# Patient Record
Sex: Male | Born: 2000 | Race: White | Hispanic: No | Marital: Single | Smoking: Never smoker
Health system: Southern US, Community
[De-identification: ages and names within clinical notes are randomized; demographics above are authoritative.]

---
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
## 2021-05-25 ENCOUNTER — Other Ambulatory Visit: Payer: Self-pay

## 2021-05-25 ENCOUNTER — Ambulatory Visit
Admission: EM | Admit: 2021-05-25 | Discharge: 2021-05-25 | Disposition: A | Discharge status: Home / Self Care | Payer: Self-pay | Attending: Family Medicine | Admitting: Family Medicine

## 2021-05-25 ENCOUNTER — Encounter: Payer: Self-pay | Admitting: Emergency Medicine

## 2021-05-25 DIAGNOSIS — T63451A Toxic effect of venom of hornets, accidental (unintentional), initial encounter: Secondary | ICD-10-CM

## 2021-05-25 DIAGNOSIS — T782XXA Anaphylactic shock, unspecified, initial encounter: Secondary | ICD-10-CM

## 2021-05-25 DIAGNOSIS — R0603 Acute respiratory distress: Secondary | ICD-10-CM

## 2021-05-25 DIAGNOSIS — T63441A Toxic effect of venom of bees, accidental (unintentional), initial encounter: Secondary | ICD-10-CM

## 2021-05-25 DIAGNOSIS — T63461A Toxic effect of venom of wasps, accidental (unintentional), initial encounter: Secondary | ICD-10-CM

## 2021-05-25 MED ORDER — METHYLPREDNISOLONE SODIUM SUCC 125 MG IJ SOLR: 125.0000 mg | Freq: Once | INTRAMUSCULAR | Status: DC

## 2021-05-25 MED ORDER — PREDNISONE 20 MG PO TABS: 40.0000 mg | ORAL_TABLET | Freq: Every day | ORAL | 0 refills | Status: DC

## 2021-05-25 MED ORDER — EPINEPHRINE 0.3 MG/0.3ML IJ SOAJ: 0.3000 mg | INTRAMUSCULAR | 0 refills | Status: AC | PRN

## 2021-05-25 MED ORDER — EPINEPHRINE PF 1 MG/ML IJ SOLN
0.3000 mg | Freq: Once | INTRAMUSCULAR | Status: AC
  Administered 2021-05-25: 0.3 mg via INTRAMUSCULAR

## 2021-05-25 MED ORDER — DIPHENHYDRAMINE HCL 50 MG/ML IJ SOLN
50.0000 mg | Freq: Once | INTRAMUSCULAR | Status: AC
  Administered 2021-05-25: 50 mg via INTRAVENOUS

## 2021-05-25 MED ORDER — METHYLPREDNISOLONE SODIUM SUCC 125 MG IJ SOLR
125.0000 mg | Freq: Once | INTRAMUSCULAR | Status: AC
  Administered 2021-05-25: 125 mg via INTRAVENOUS

## 2021-05-25 MED ORDER — DIPHENHYDRAMINE HCL 50 MG PO CAPS: 50.0000 mg | ORAL_CAPSULE | Freq: Once | ORAL | Status: DC

## 2021-05-25 NOTE — ED Triage Notes (Signed)
Pt got stung by a bee on RT hand x 30 min ago, now is itching all over and has rash all over body

## 2021-05-25 NOTE — ED Provider Notes (Signed)
[place]   [id number] 05/25/21 Arrival Time: 3500  ASSESSMENT & PLAN:  1. Anaphylaxis, initial encounter   2. Toxic reaction to hornets, wasps and bees, accidental or unintentional, initial encounter   3. Respiratory distress    Significant improvement after treatment below. Resp distress has resolved. Breathing comfortably. Tolerates PO liquids without issue. Alert.  Meds ordered this encounter  Medications   EPINEPHrine (ADRENALIN) 0.3 mg   diphenhydrAMINE (BENADRYL) injection 50 mg   methylPREDNISolone sodium succinate (SOLU-MEDROL) 125 mg/2 mL injection 125 mg   Discharged with caregiver to home who is comfortable with observation over next 24 hours.  Prescribed: Meds ordered this encounter  Medications   EPINEPHrine 0.3 mg/0.3 mL IJ SOAJ injection (EpiPEN)    Sig: Inject 0.3 mg into the muscle as needed for anaphylaxis.    Dispense:  1 each    Refill:  0   predniSONE (DELTASONE) 20 MG tablet    Sig: Take 2 tablets (40 mg total) by mouth daily.    Dispense:  10 tablet    Refill:  0   OTC Benadryl if needed.   Follow-up Information     [facility] EMERGENCY DEPARTMENT.   Specialty: Emergency Medicine Why: If symptoms worsen in any way. Contact information: [address] [id number] Prudy Feeler [place] [address] [phone]                Reviewed expectations re: course of current medical issues. Questions answered. Outlined signs and symptoms indicating need for more acute intervention. Patient verbalized understanding. After Visit Summary given.   SUBJECTIVE: History from: patient. Phillip Watkins is a 20 y.o. male who reports insect sting to R hand; approx 30 m ago. Rash, itching now. While here reports feeling SOB. Without n/v/d. Ambulatory. No HA or visual changes. No h/o reaction to stings in the past. Without abd or back pain. No tx PTA.  OBJECTIVE:  Vitals:   05/25/21 1019 05/25/21 1021  BP:  121/64  Pulse: 91    Resp: 20   Temp: 98.4 F (36.9 C)   TempSrc: Oral   SpO2: 97%     General appearance: alert; appears uncomfortable; mild resp distress Eyes: PERRLA; EOMI; conjunctiva normal HENT: normocephalic; atraumatic; oral mucosa normal; PERRLA Neck: supple  Lungs: bilateral exp wheezes present; able to speak in short sentences Heart: regular rate and rhythm Abdomen: soft, non-tender Back: no CVA tenderness Extremities: no cyanosis or edema; symmetrical with no gross deformities Skin: warm and dry; site of sting noted on R hand; mild swelling; no stinger present; mild surrounding erythema; generalized erythematous wheals over body Neurologic: normal gait; normal symmetric reflexes Psychological: alert and cooperative; normal mood and affect  No Known Allergies  History reviewed. No pertinent past medical history. Social History   Socioeconomic History   Marital status: Single    Spouse name: Not on file   Number of children: Not on file   Years of education: Not on file   Highest education level: Not on file  Occupational History   Not on file  Tobacco Use   Smoking status: Not on file   Smokeless tobacco: Not on file  Substance and Sexual Activity   Alcohol use: Not on file   Drug use: Not on file   Sexual activity: Not on file  Other Topics Concern   Not on file  Social History Narrative   Not on file   Social Determinants of Health   Financial Resource Strain: Not on file  Food Insecurity: Not on file  Transportation Needs: Not on file  Physical Activity: Not on file  Stress: Not on file  Social Connections: Not on file  Intimate Partner Violence: Not on file   No family history on file. History reviewed. No pertinent surgical history.   Vanessa Kick, MD 05/25/21 1230

## 2021-06-11 ENCOUNTER — Ambulatory Visit
Admission: EM | Admit: 2021-06-11 | Discharge: 2021-06-11 | Disposition: A | Discharge status: Home / Self Care | Payer: 59 | Attending: Emergency Medicine | Admitting: Emergency Medicine

## 2021-06-11 DIAGNOSIS — L237 Allergic contact dermatitis due to plants, except food: Secondary | ICD-10-CM

## 2021-06-11 DIAGNOSIS — R21 Rash and other nonspecific skin eruption: Secondary | ICD-10-CM | POA: Diagnosis not present

## 2021-06-11 MED ORDER — PREDNISONE 10 MG (21) PO TBPK: ORAL_TABLET | Freq: Every day | ORAL | 0 refills | Status: AC

## 2021-06-11 MED ORDER — DEXAMETHASONE SODIUM PHOSPHATE 10 MG/ML IJ SOLN
10.0000 mg | Freq: Once | INTRAMUSCULAR | Status: AC
  Administered 2021-06-11: 10 mg via INTRAMUSCULAR

## 2021-06-11 NOTE — Discharge Instructions (Addendum)
Wash with warm water and mild soap Steroid shot given in office Prednisone prescribed.  Take as directed and to completion Use OTC zyrtec, allegra, or claritin during the day.  Benadryl at night. You may also use OTC hydrocortisone cream and/or calamine lotion to help alleviate itching Follow up with PCP if symptoms persists  Return or go to the ED if you have any new or worsening symptoms such as fever, chills, nausea, vomiting, difficulty breathing, throat swelling, tongue swelling, numbness/ tingling in mouth, worsening symptoms despite treatment, etc..Marland Kitchen

## 2021-06-11 NOTE — ED Triage Notes (Signed)
Pt present to the UC for generalized body rash x 1 week. 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 Treating rash with benadryl with no relief.   Denies fever.

## 2021-06-11 NOTE — ED Provider Notes (Signed)
[place]   [id number] 06/11/21 Arrival Time: 1204  CC: rash  SUBJECTIVE:  Phillip Watkins is a 20 y.o. male who presents with a rash to UE x 1 week.  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  Localizes the rash to UE and chest  Describes it as itchy, red, and spreading.  Has tried OTC benadryl with relief.  Symptoms are made worse with scratching.  Denies similar symptoms in the past.   Denies fever, chills, nausea, vomiting, swelling, discharge.  ROS: As per HPI.  All other pertinent ROS negative.     History reviewed. No pertinent past medical history. History reviewed. No pertinent surgical history. Allergies  Allergen Reactions   Bee Venom Hives and Shortness Of Breath   No current facility-administered medications on file prior to encounter.   Current Outpatient Medications on File Prior to Encounter  Medication Sig Dispense Refill   EPINEPHrine 0.3 mg/0.3 mL IJ SOAJ injection Inject 0.3 mg into the muscle as needed for anaphylaxis. 1 each 0   Social History   Socioeconomic History   Marital status: Single    Spouse name: Not on file   Number of children: Not on file   Years of education: Not on file   Highest education level: Not on file  Occupational History   Not on file  Tobacco Use   Smoking status: Never   Smokeless tobacco: Never  Vaping Use   Vaping Use: Never used  Substance and Sexual Activity   Alcohol use: Not Currently    Comment: social drinker   Drug use: Never   Sexual activity: Not on file  Other Topics Concern   Not on file  Social History Narrative   Not on file   Social Determinants of Health   Financial Resource Strain: Not on file  Food Insecurity: Not on file  Transportation Needs: Not on file  Physical Activity: Not on file  Stress: Not on file  Social Connections: Not on file  Intimate Partner Violence: Not on file   History reviewed. No pertinent family history.  OBJECTIVE: Vitals:    06/11/21 1233  BP: 120/75  Pulse: 62  Resp: 18  Temp: 97.9 F (36.6 C)  TempSrc: Oral  SpO2: 97%    General appearance: alert; no distress Head: NCAT Lungs: normal respiratory effort Extremities: no edema Skin: warm and dry; areas of linear papules and vesicles with surrounding erythema to bilateral UE and chest Psychological: alert and cooperative; normal mood and affect  ASSESSMENT & PLAN:  1. Rash and nonspecific skin eruption   2. Poison ivy dermatitis     Meds ordered this encounter  Medications   predniSONE (STERAPRED UNI-PAK 21 TAB) 10 MG (21) TBPK tablet    Sig: Take by mouth daily. Take 6 tabs by mouth daily  for 2 days, then 5 tabs for 2 days, then 4 tabs for 2 days, then 3 tabs for 2 days, 2 tabs for 2 days, then 1 tab by mouth daily for 2 days    Dispense:  42 tablet    Refill:  0    Order Specific Question:   Supervising Provider    Answer:   Raylene Everts [[id number]]   dexamethasone (DECADRON) injection 10 mg   Wash with warm water and mild soap Steroid shot given in office Prednisone prescribed.  Take as directed and to completion Use OTC zyrtec, allegra, or claritin during the day.  Benadryl at night. You may also use  OTC hydrocortisone cream and/or calamine lotion to help alleviate itching Follow up with PCP if symptoms persists  Return or go to the ED if you have any new or worsening symptoms such as fever, chills, nausea, vomiting, difficulty breathing, throat swelling, tongue swelling, numbness/ tingling in mouth, worsening symptoms despite treatment, etc...   Reviewed expectations re: course of current medical issues. Questions answered. Outlined signs and symptoms indicating need for more acute intervention. Patient verbalized understanding. After Visit Summary given.    Lestine Box, PA-C 06/11/21 1301

## 2022-04-12 ENCOUNTER — Ambulatory Visit: Admit: 2022-04-12 | Payer: 59 | Admitting: Orthopedic Surgery

## 2022-04-12 SURGERY — LUMBAR LAMINECTOMY/DECOMPRESSION MICRODISCECTOMY 1 LEVEL
Anesthesia: General | Laterality: Right
